# Patient Record
Sex: Female | Born: 1990 | Race: White | Hispanic: No | Marital: Single | Smoking: Former smoker
Health system: Southern US, Community
[De-identification: ages and names within clinical notes are randomized; demographics above are authoritative.]

## PROBLEM LIST (undated history)

## (undated) DIAGNOSIS — F329 Major depressive disorder, single episode, unspecified: Secondary | ICD-10-CM

## (undated) DIAGNOSIS — E0590 Thyrotoxicosis, unspecified without thyrotoxic crisis or storm: Secondary | ICD-10-CM

## (undated) DIAGNOSIS — E282 Polycystic ovarian syndrome: Secondary | ICD-10-CM

## (undated) DIAGNOSIS — F419 Anxiety disorder, unspecified: Secondary | ICD-10-CM

## (undated) DIAGNOSIS — F32A Depression, unspecified: Secondary | ICD-10-CM

---
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
## 2010-03-12 ENCOUNTER — Emergency Department: Payer: Self-pay | Admitting: Emergency Medicine

## 2011-07-30 ENCOUNTER — Ambulatory Visit: Payer: Self-pay | Admitting: Family Medicine

## 2011-11-16 ENCOUNTER — Ambulatory Visit: Payer: Self-pay | Admitting: Family Medicine

## 2013-01-08 IMAGING — US US EXTREM LOW VENOUS*L*
1 series · 14 of 24 positions shown · non-contrast
Comparison: none

REASON FOR EXAM: left leg pain and swelling eval for DVT
COMMENTS:

[Series 1: us extrem low venous*left* · 0.09mm/px · 14 of 27 slices shown]
[im 1/27]
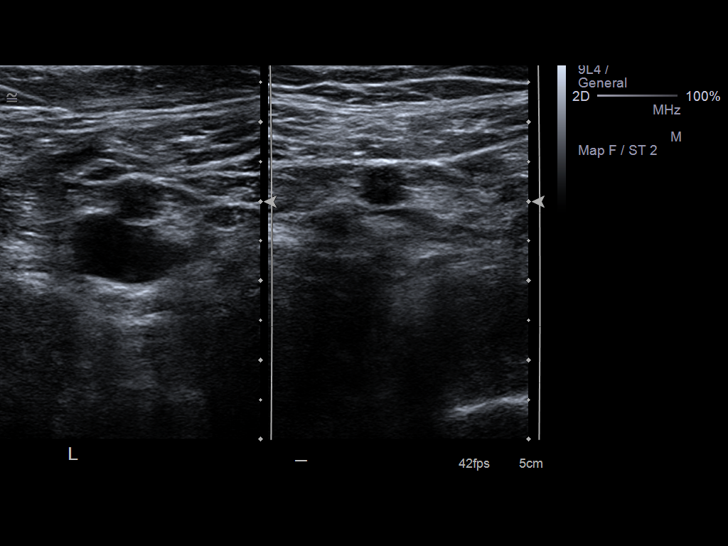
[im 3/27]
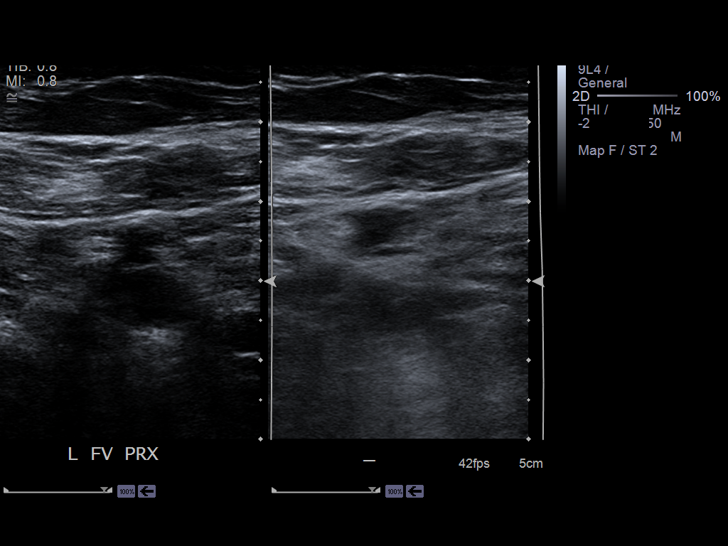
[im 5/27]
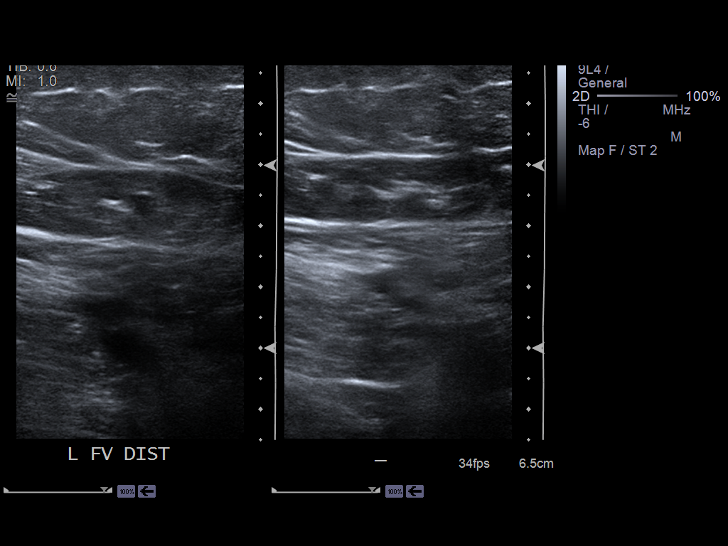
[im 7/27]
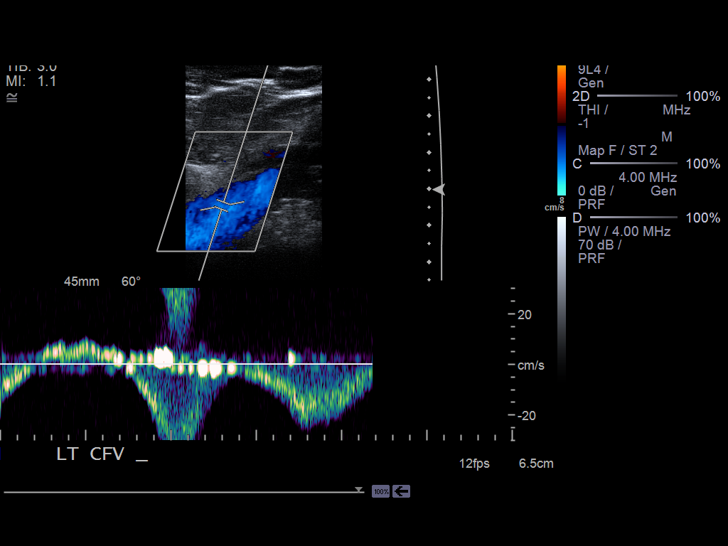
[im 8/27]
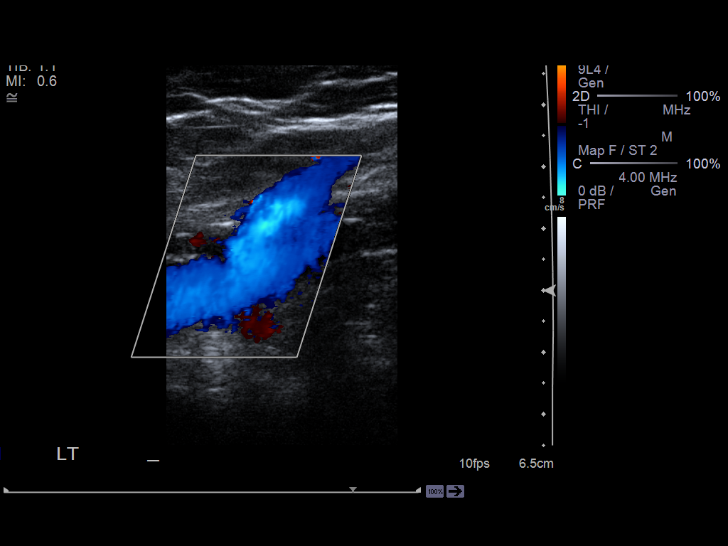
[im 11/27]
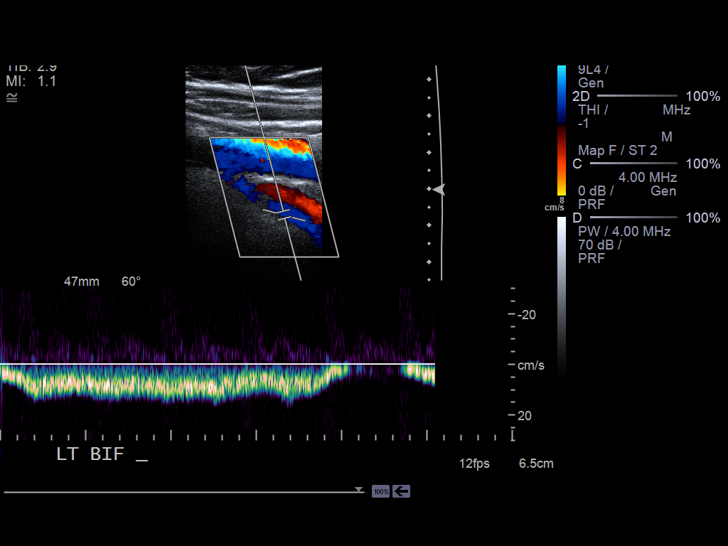
[im 13/27]
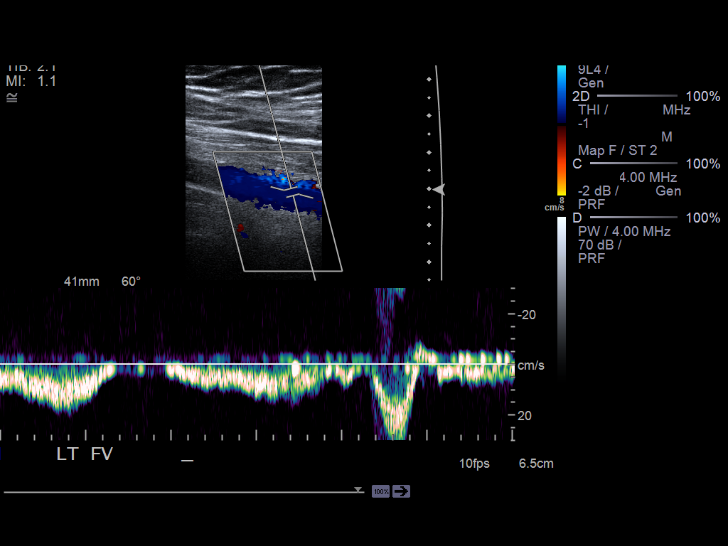
[im 14/27]
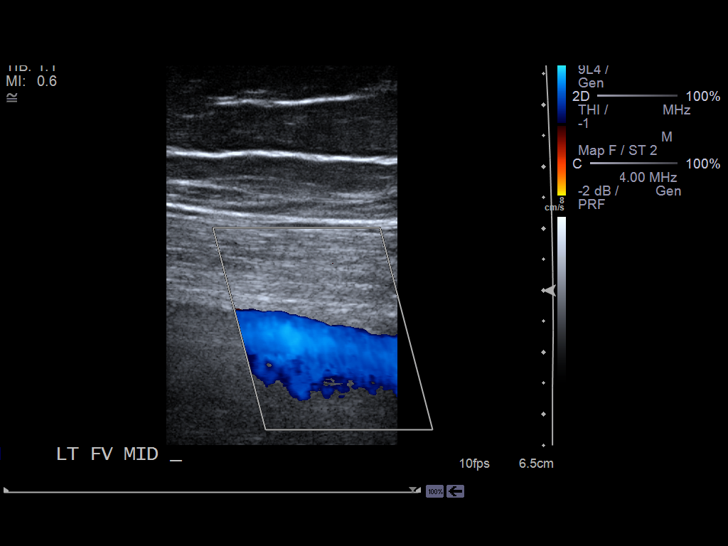
[im 16/27]
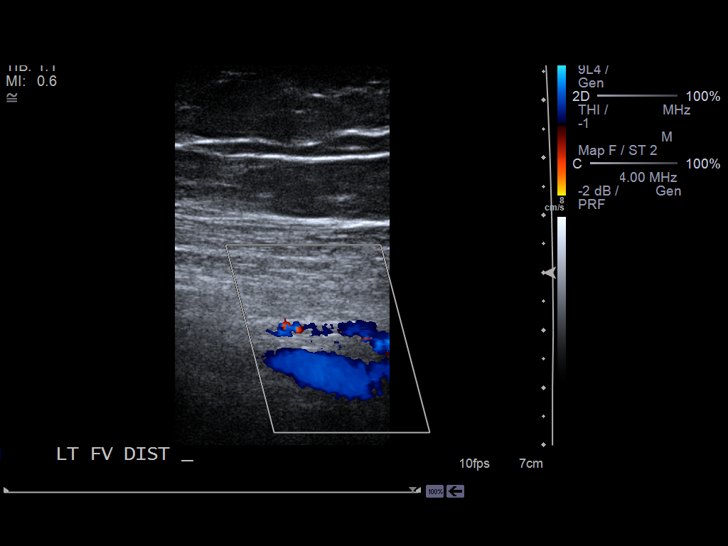
[im 19/27]
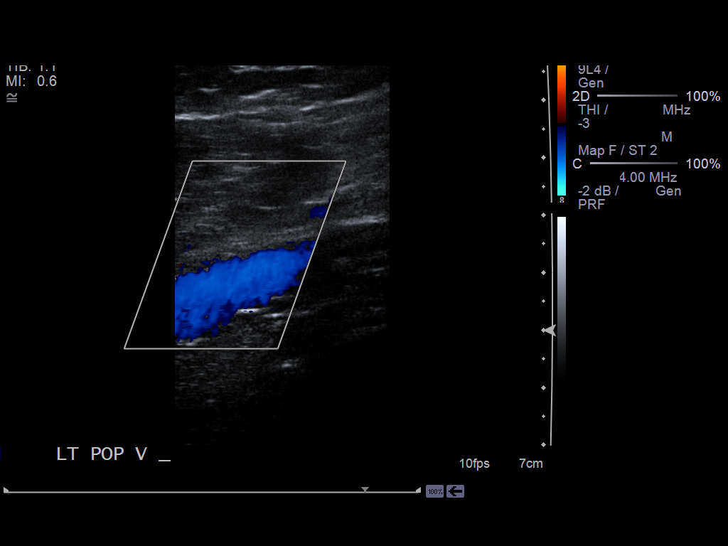
[im 21/27]
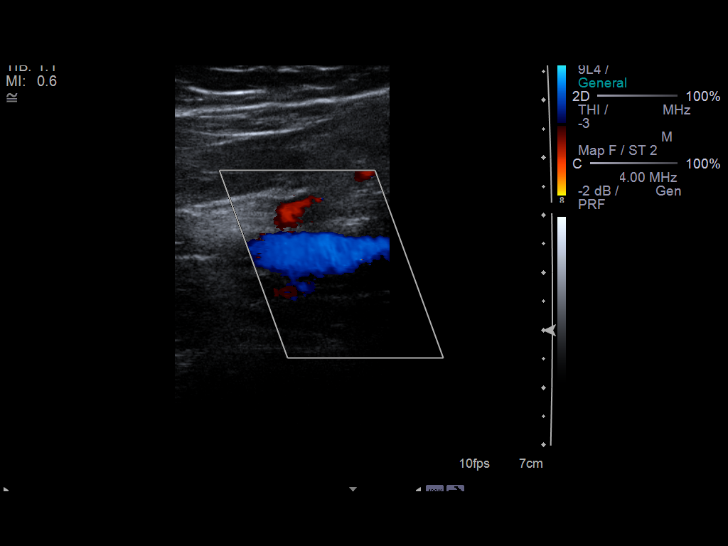
[im 22/27]
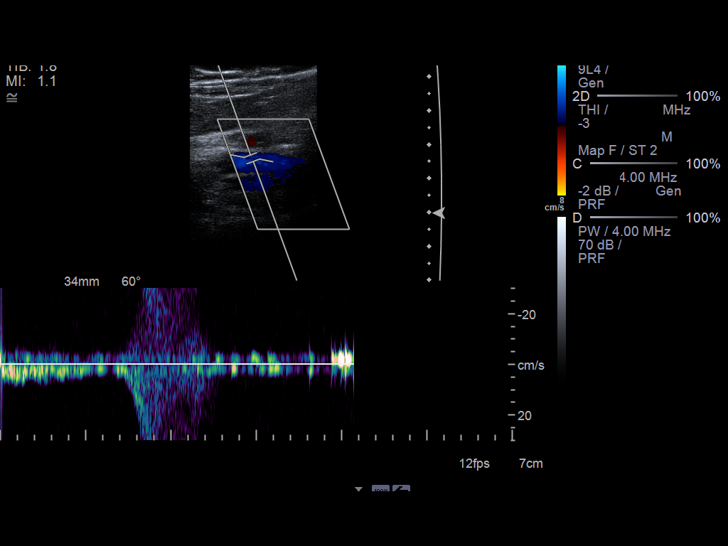
[im 24/27]
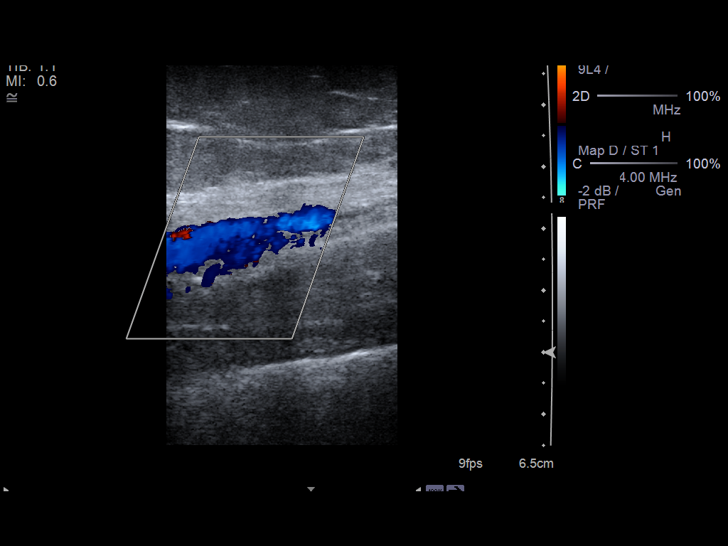
[im 27/27]
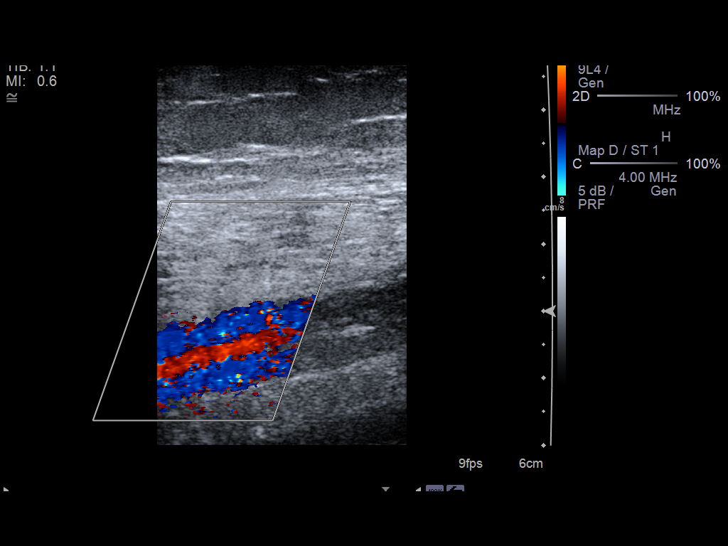

[14 of 24 positions shown; findings below may reference images not displayed]

PROCEDURE:     US  - US DOPPLER LOW EXTR LEFT  - July 30, 2011  [DATE]

RESULT:     The phasic, augmentation and Valsalva flow waveforms are normal
in appearance. The left femoral and popliteal vein shows complete
compressibility throughout its course. Doppler examination shows no
occlusion or evidence for deep vein thrombosis.
IMPRESSION: No deep vein thrombosis is identified in the left leg.

[REDACTED]

## 2013-04-27 IMAGING — CT CT ABD-PELV W/ CM
1 of 2 series · 15 of 32 positions shown, 19 images · non-contrast
Comparison: none

REASON FOR EXAM: pelvic pain  abd pain
COMMENTS:

PROCEDURE:     CT  - CT ABDOMEN / PELVIS  W  - November 16, 2011  [DATE]
RESULT:     CT abdomen and pelvis dated 11/16/2011.
TECHNIQUE: Helical 3 mm sections were obtained from the lung bases through
the pubic symphysis status post intravenous administration of 100 mL of
[id number] and oral contrast.

[Series 2: soft tissue · axial · 0.81mm/px · z∈[-528,-66]mm · 15 of 170 slices shown, 19 images]
[im 8/170  soft-tissue]
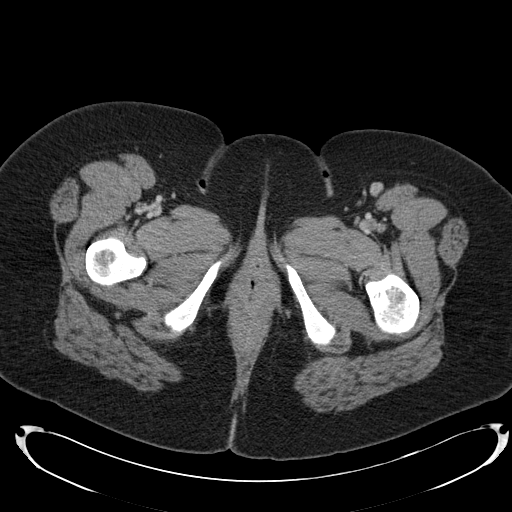
[im 8/170  bone]
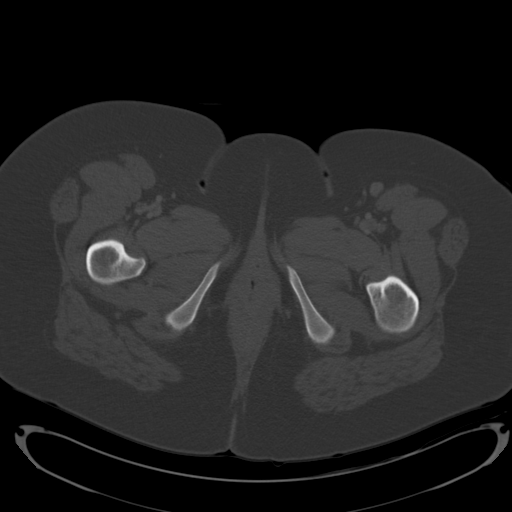
[im 23/170  soft-tissue]
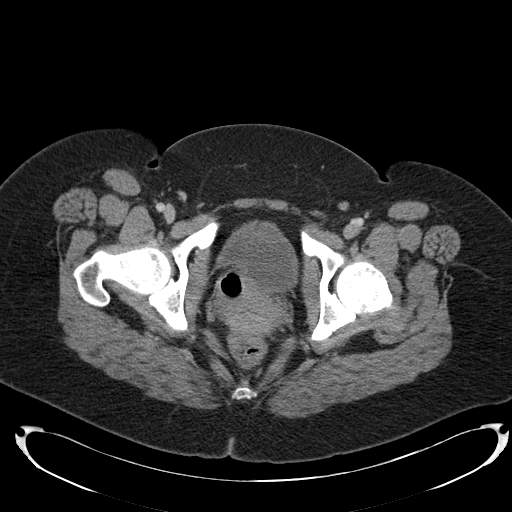
[im 37/170  soft-tissue]
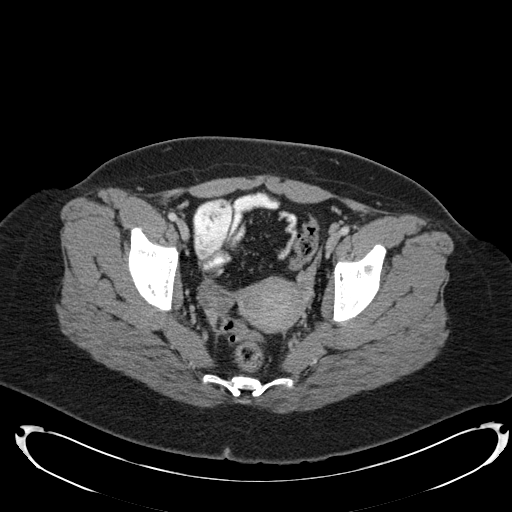
[im 45/170  soft-tissue]
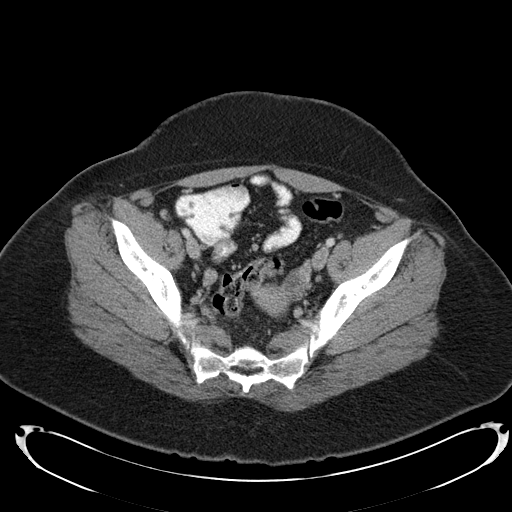
[im 59/170  soft-tissue]
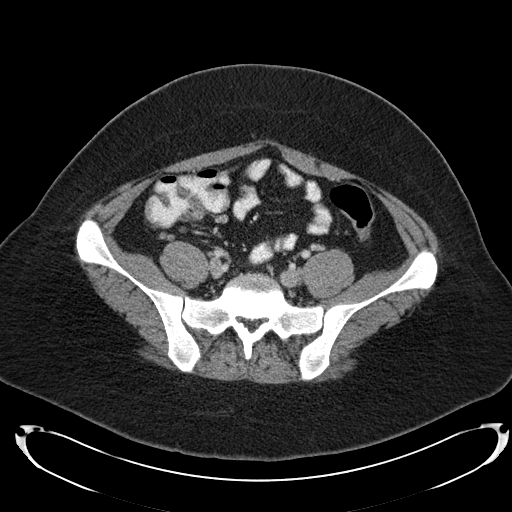
[im 74/170  soft-tissue]
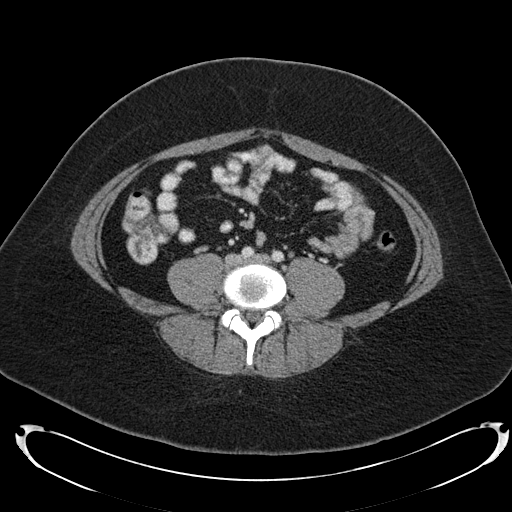
[im 89/170  soft-tissue]
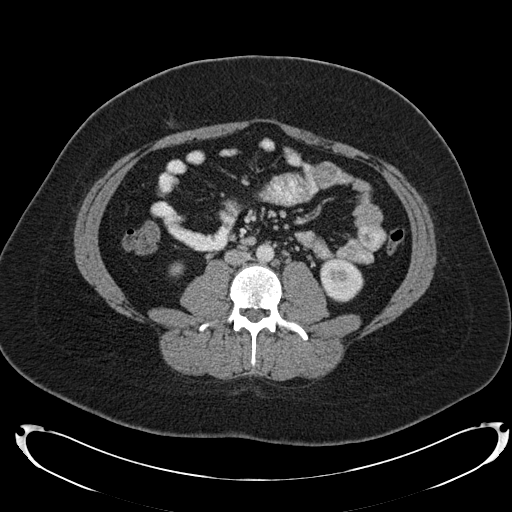
[im 96/170  soft-tissue]
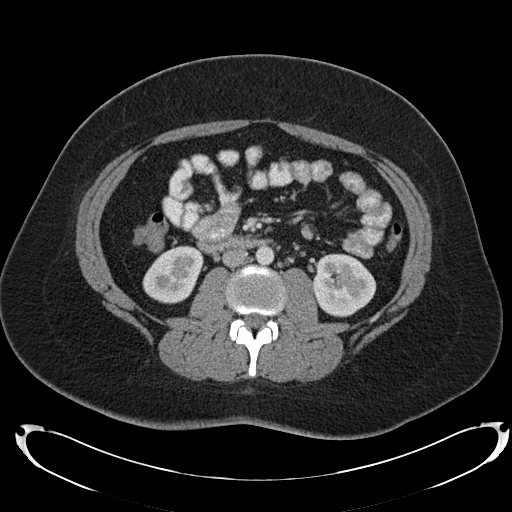
[im 111/170  soft-tissue]
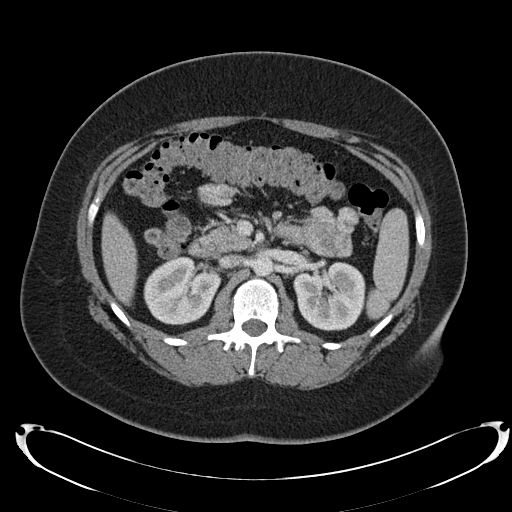
[im 111/170  bone]
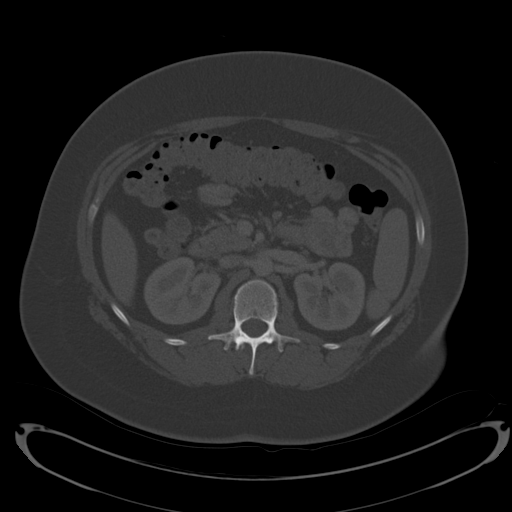
[im 125/170  soft-tissue]
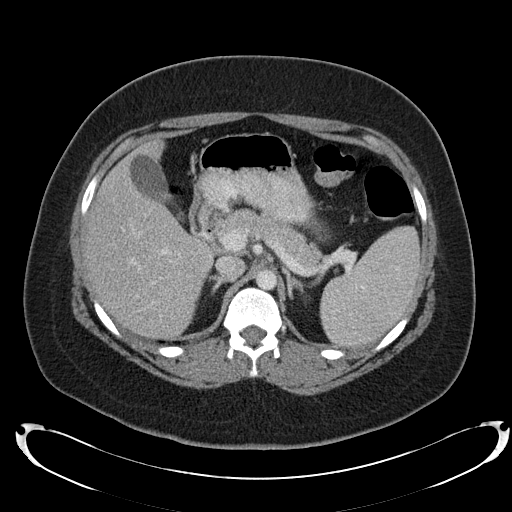
[im 133/170  soft-tissue]
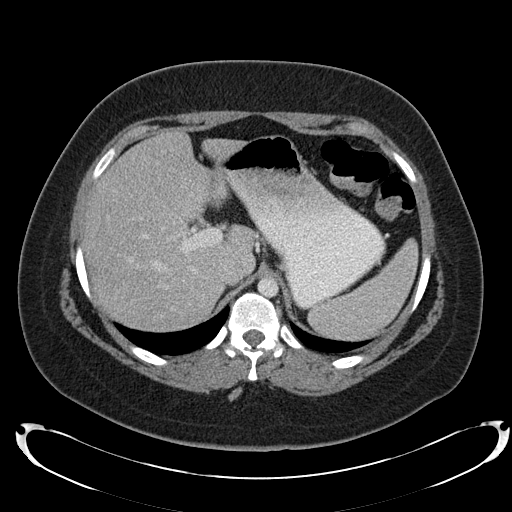
[im 140/170  lung]
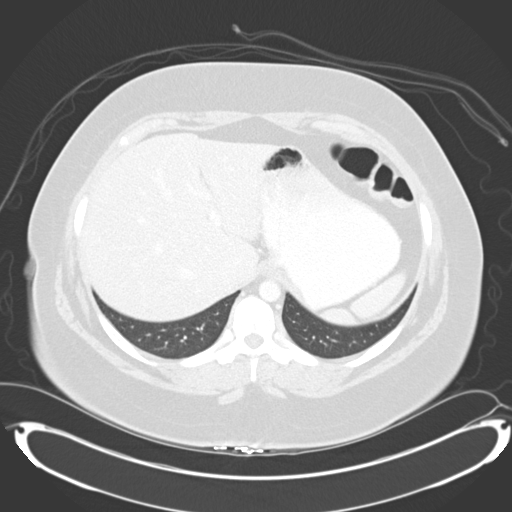
[im 147/170  soft-tissue]
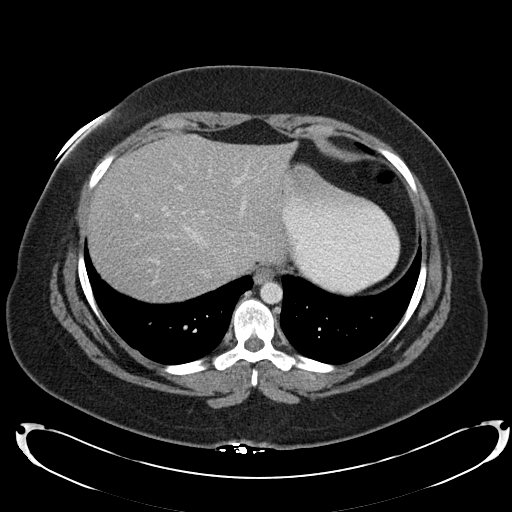
[im 147/170  lung]
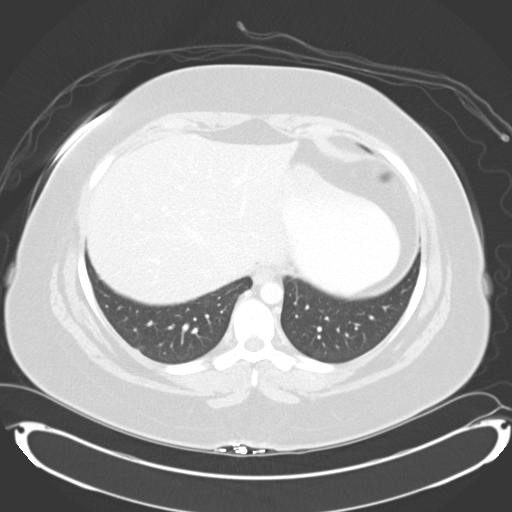
[im 155/170  lung]
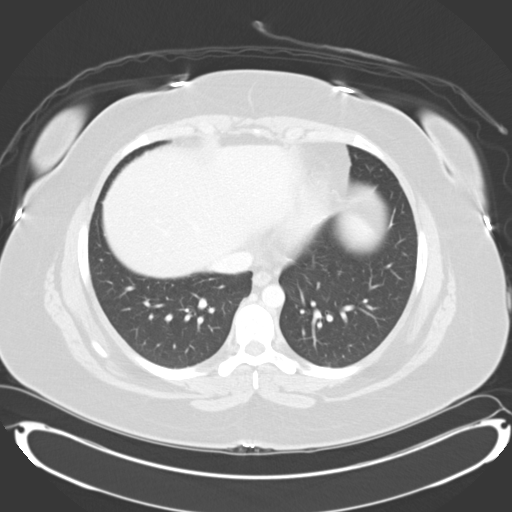
[im 162/170  soft-tissue]
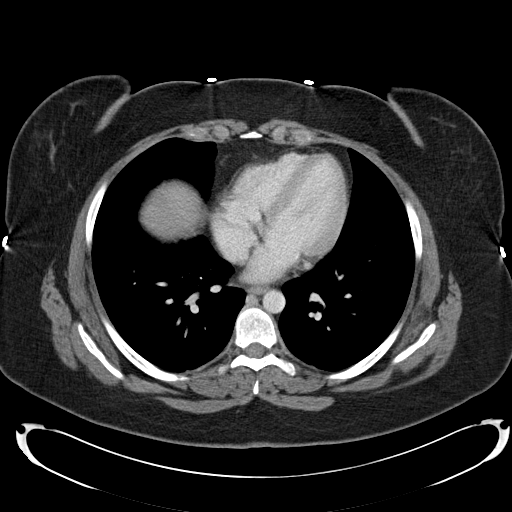
[im 162/170  lung]
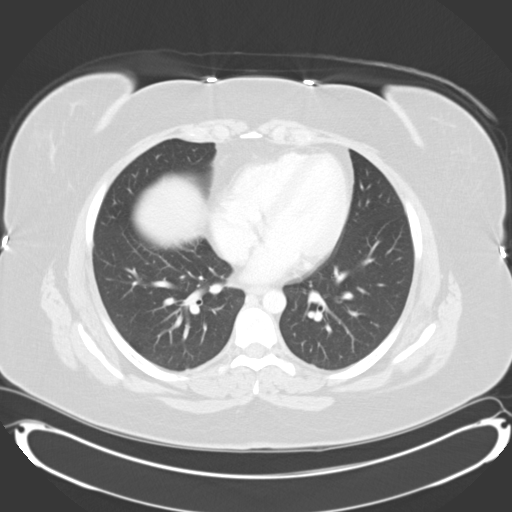

[15 of 32 positions shown; findings below may reference images not displayed]

FINDINGS: The lung bases are unremarkable.

The liver demonstrates a diffusely mild low attenuating architecture and is
otherwise unremarkable. The spleen, adrenals, pancreas, kidneys are
unremarkable. There is no evidence of an abdominal aortic aneurysm. The
celiac, SMA, IMA, portal vein, SMV are opacified.

There is no evidence of bowel obstruction, enteritis, diverticulitis common
nor appendicitis. The appendix is identified and is unremarkable.
Subcentimeter lymph nodes identified within the paracolic gutter region,
pericaval, periaortic and mesenteric regions.

There is no evidence of abdominal pelvic masses, free fluid or loculated
fluid collections.
IMPRESSION: Nonspecific subcentimeter lymph nodes within the abdomen
and retroperitoneal regions described above. These may represent reactive
lymph nodes no etiologies such as mesenteric adenitis of diagnostic
consideration.
2. Otherwise no further evidence of obstructive or inflammatory
abnormalities.

## 2017-08-09 ENCOUNTER — Ambulatory Visit
Admission: EM | Admit: 2017-08-09 | Discharge: 2017-08-09 | Disposition: A | Discharge status: Home / Self Care | Payer: Medicaid Other | Attending: Family Medicine | Admitting: Family Medicine

## 2017-08-09 ENCOUNTER — Other Ambulatory Visit: Payer: Self-pay

## 2017-08-09 DIAGNOSIS — M545 Low back pain: Secondary | ICD-10-CM | POA: Diagnosis not present

## 2017-08-09 HISTORY — DX: Depression, unspecified: F32.A

## 2017-08-09 HISTORY — DX: Polycystic ovarian syndrome: E28.2

## 2017-08-09 HISTORY — DX: Anxiety disorder, unspecified: F41.9

## 2017-08-09 HISTORY — DX: Major depressive disorder, single episode, unspecified: F32.9

## 2017-08-09 MED ORDER — CYCLOBENZAPRINE HCL 10 MG PO TABS: 10.0000 mg | ORAL_TABLET | Freq: Two times a day (BID) | ORAL | 0 refills | Status: AC | PRN

## 2017-08-09 MED ORDER — MELOXICAM 15 MG PO TABS: 15.0000 mg | ORAL_TABLET | Freq: Every day | ORAL | 0 refills | Status: DC | PRN

## 2017-08-09 MED ORDER — KETOROLAC TROMETHAMINE 60 MG/2ML IM SOLN
60.0000 mg | Freq: Once | INTRAMUSCULAR | Status: AC
  Administered 2017-08-09: 60 mg via INTRAMUSCULAR

## 2017-08-09 NOTE — ED Triage Notes (Signed)
Pt with low back pain after bending over while putting together a crib last p.m. Felt a "pop" in her low back and then had some pain running down her right leg. Pain 8/10

## 2017-08-09 NOTE — ED Provider Notes (Signed)
[facility] ____________________________________________  Time seen: Approximately 12:10 PM  I have reviewed the triage vital signs and the nursing notes.   HISTORY  Chief Complaint Back Pain  HPI Maria Melton is a 27 y.o. female presenting for evaluation of low back pain present since last night.  Patient states that she was helping put the fitted sheet on her niece's crib last night, and doing states she was bending over the crib for a few minutes.  States when she stood back up she felt a pop to her low back with associated pain.  Denies any fall to the ground, direct trauma or direct injury.  Reports pain has been in the low back since, but states initially she had some right sided leg pain radiation with tingling sensation lasting for a few hours and has since resolved.  Denies any urinary or bowel retention or incontinence. Denies pain radiation, abdominal pain, dysuria, fevers.  States that she does have a history of some intermittent low back pain and sciatica.  States she has had sciatic issues in the past, with last few years ago.  States that she did take over-the-counter ibuprofen last night without much change.  States the pain is better with sitting completely still and upright, but states as soon as she moves she feels like catching pain. Denies chest pain, shortness of breath, abdominal pain, or rash. Denies recent sickness. Denies recent antibiotic use.   Etheleen Nicks, NP: PCP Patient's last menstrual period was 07/22/2017.Denies pregnancy.   Past Medical History:  Diagnosis Date  . Anxiety   . Depression   . PCOS (polycystic ovarian syndrome)     There are no active problems to display for this patient.   History reviewed. No pertinent surgical history.   No current facility-administered medications for this encounter.   Current Outpatient Medications:  .  buPROPion (WELLBUTRIN XL) 150 MG 24 hr tablet, Take by mouth., Disp: , Rfl:  .   metFORMIN (GLUCOPHAGE) 500 MG tablet, Take by mouth., Disp: , Rfl:  .  spironolactone (ALDACTONE) 50 MG tablet, Take by mouth., Disp: , Rfl:  .  cyclobenzaprine (FLEXERIL) 10 MG tablet, Take 1 tablet (10 mg total) by mouth 2 (two) times daily as needed for muscle spasms. Do not drive while taking as can cause drowsiness, Disp: 15 tablet, Rfl: 0 .  levonorgestrel (MIRENA, 52 MG,) 20 MCG/24HR IUD, by Intrauterine route., Disp: , Rfl:  .  meloxicam (MOBIC) 15 MG tablet, Take 1 tablet (15 mg total) by mouth daily as needed., Disp: 10 tablet, Rfl: 0  Allergies Patient has no known allergies.  History reviewed. No pertinent family history.  Social History Social History   Tobacco Use  . Smoking status: Current Every Day Smoker    Packs/day: 0.25    Types: Cigarettes  . Smokeless tobacco: Never Used  Substance Use Topics  . Alcohol use: Never    Frequency: Never  . Drug use: Never    Review of Systems Constitutional: No fever/chills Cardiovascular: Denies chest pain. Respiratory: Denies shortness of breath. Gastrointestinal: No abdominal pain.  No nausea, no vomiting.  No diarrhea.  Genitourinary: Negative for dysuria. Musculoskeletal: positive for back pain. Skin: Negative for rash.  ____________________________________________   PHYSICAL EXAM:  VITAL SIGNS: ED Triage Vitals  Enc Vitals Group     BP 08/09/17 1120 122/82     Pulse Rate 08/09/17 1120 76     Resp 08/09/17 1120 16     Temp 08/09/17 1120 98.9 F (  37.2 C)     Temp Source 08/09/17 1120 Oral     SpO2 08/09/17 1120 99 %     Weight 08/09/17 1121 225 lb (102.1 kg)     Height 08/09/17 1121 5\' 6"  (1.676 m)     Head Circumference --      Peak Flow --      Pain Score 08/09/17 1121 8     Pain Loc --      Pain Edu? --      Excl. in GC? --     Constitutional: Alert and oriented. Well appearing and in no acute distress ENT      Head: Normocephalic and atraumatic. Cardiovascular: Normal rate, regular rhythm.  Grossly normal heart sounds.  Good peripheral circulation. Respiratory: Normal respiratory effort without tachypnea nor retractions. Breath sounds are clear and equal bilaterally. No wheezes, rales, rhonchi. Gastrointestinal: Soft and nontender.  Musculoskeletal:Bilateral pedal pulses equal and easily palpated.  Ambulatory with steady gait.  Changes positions quickly in room without distress.      Right lower leg:  No tenderness or edema.      Left lower leg:  No tenderness or edema.  Except: Diffuse midline lumbar mild to moderate tenderness to palpation and mild paralumbar tenderness to palpation.  Pain increases with lumbar flexion, extension as well as rotation but full range of motion present.  No saddle anesthesia.  No pain with standing bilateral knee lifts.  2+ bilateral patellar and Achilles reflexes present bilaterally.   Neurologic:  Normal speech and language. No gross focal neurologic deficits are appreciated. Speech is normal. No gait instability.  Skin:  Skin is warm, dry and intact. No rash noted. Psychiatric: Mood and affect are normal. Speech and behavior are normal. Patient exhibits appropriate insight and judgment   ___________________________________________   LABS (all labs ordered are listed, but only abnormal results are displayed)  Labs Reviewed - No data to display   PROCEDURES Procedures    INITIAL IMPRESSION / ASSESSMENT AND PLAN / ED COURSE  Pertinent labs & imaging results that were available during my care of the patient were reviewed by me and considered in my medical decision making (see chart for details).  Well-appearing patient.  No acute distress.  Presenting for evaluation of back pain.  No direct trauma.  Pain after standing up from bending position.  Suspect strain injury, also discussed disc issues.  Will defer x-ray at this time, patient agrees.  Will treat patient with daily Mobic and PRN muscle relaxant.  60 mg IM Toradol given once in urgent  care.  Encouraged rest, stretching, supportive care.  Follow-up with PCP or orthopedic as needed for continued pain.Discussed indication, risks and benefits of medications with patient.  Discussed follow up and return parameters including no resolution or any worsening concerns. Patient verbalized understanding and agreed to plan.   ____________________________________________   FINAL CLINICAL IMPRESSION(S) / ED DIAGNOSES  Final diagnoses:  Acute bilateral low back pain, with sciatica presence unspecified     ED Discharge Orders        Ordered    cyclobenzaprine (FLEXERIL) 10 MG tablet  2 times daily PRN     08/09/17 1211    meloxicam (MOBIC) 15 MG tablet  Daily PRN     08/09/17 1211       Note: This dictation was prepared with Dragon dictation along with smaller phrase technology. Any transcriptional errors that result from this process are unintentional.         Hyacinth Meeker,  Mardella LaymanLindsey, NP 08/09/17 1244

## 2017-08-09 NOTE — Discharge Instructions (Addendum)
Take medication as prescribed. Rest. Drink plenty of fluids. Stretch as discussed.   Follow up with your primary care physician or the above as needed for continued pain.  Return to Urgent care or Emergency room for new or worsening concerns.

## 2017-11-06 ENCOUNTER — Encounter: Payer: Self-pay | Admitting: *Deleted

## 2017-11-06 ENCOUNTER — Other Ambulatory Visit: Payer: Self-pay

## 2017-11-06 ENCOUNTER — Emergency Department: Payer: Medicaid Other

## 2017-11-06 ENCOUNTER — Emergency Department
Admission: EM | Admit: 2017-11-06 | Discharge: 2017-11-06 | Disposition: A | Discharge status: Home / Self Care | Payer: Medicaid Other | Attending: Emergency Medicine | Admitting: Emergency Medicine

## 2017-11-06 DIAGNOSIS — F17210 Nicotine dependence, cigarettes, uncomplicated: Secondary | ICD-10-CM | POA: Diagnosis not present

## 2017-11-06 DIAGNOSIS — R079 Chest pain, unspecified: Secondary | ICD-10-CM | POA: Diagnosis present

## 2017-11-06 DIAGNOSIS — Z3202 Encounter for pregnancy test, result negative: Secondary | ICD-10-CM | POA: Insufficient documentation

## 2017-11-06 DIAGNOSIS — R0602 Shortness of breath: Secondary | ICD-10-CM | POA: Insufficient documentation

## 2017-11-06 DIAGNOSIS — R05 Cough: Secondary | ICD-10-CM | POA: Diagnosis not present

## 2017-11-06 DIAGNOSIS — R0789 Other chest pain: Secondary | ICD-10-CM | POA: Diagnosis not present

## 2017-11-06 HISTORY — DX: Thyrotoxicosis, unspecified without thyrotoxic crisis or storm: E05.90

## 2017-11-06 LAB — BASIC METABOLIC PANEL
Anion gap: 9 (ref 5–15)
BUN: 9 mg/dL (ref 6–20)
CALCIUM: 9.3 mg/dL (ref 8.9–10.3)
CO2: 26 mmol/L (ref 22–32)
CREATININE: 0.74 mg/dL (ref 0.44–1.00)
Chloride: 103 mmol/L (ref 98–111)
GLUCOSE: 94 mg/dL (ref 70–99)
Potassium: 4 mmol/L (ref 3.5–5.1)
SODIUM: 138 mmol/L (ref 135–145)

## 2017-11-06 LAB — CBC
HCT: 46.5 % (ref 35.0–47.0)
Hemoglobin: 16.4 g/dL — ABNORMAL HIGH (ref 12.0–16.0)
MCH: 31.3 pg (ref 26.0–34.0)
MCHC: 35.2 g/dL (ref 32.0–36.0)
MCV: 88.8 fL (ref 80.0–100.0)
PLATELETS: 400 10*3/uL (ref 150–440)
RBC: 5.23 MIL/uL — ABNORMAL HIGH (ref 3.80–5.20)
RDW: 13 % (ref 11.5–14.5)
WBC: 15.3 10*3/uL — ABNORMAL HIGH (ref 3.6–11.0)

## 2017-11-06 LAB — FIBRIN DERIVATIVES D-DIMER (ARMC ONLY): Fibrin derivatives D-dimer (ARMC): 194.48 ng/mL (FEU) (ref 0.00–499.00)

## 2017-11-06 LAB — POCT PREGNANCY, URINE: PREG TEST UR: NEGATIVE

## 2017-11-06 LAB — TROPONIN I

## 2017-11-06 MED ORDER — MELOXICAM 15 MG PO TABS: 15.0000 mg | ORAL_TABLET | Freq: Every day | ORAL | 2 refills | Status: AC

## 2017-11-06 MED ORDER — IBUPROFEN 800 MG PO TABS: 800.0000 mg | ORAL_TABLET | Freq: Three times a day (TID) | ORAL | 0 refills | Status: DC | PRN

## 2017-11-06 MED ORDER — DIAZEPAM 5 MG PO TABS: 5.0000 mg | ORAL_TABLET | Freq: Three times a day (TID) | ORAL | 0 refills | Status: AC | PRN

## 2017-11-06 MED ORDER — KETOROLAC TROMETHAMINE 30 MG/ML IJ SOLN
30.0000 mg | Freq: Once | INTRAMUSCULAR | Status: AC
  Administered 2017-11-06: 30 mg via INTRAVENOUS
  Filled 2017-11-06: qty 1

## 2017-11-06 NOTE — ED Provider Notes (Signed)
[facility] Emergency Department Provider Note       Time seen: ----------------------------------------- 9:02 PM on 11/06/2017 -----------------------------------------   I have reviewed the triage vital signs and the nursing notes.  HISTORY   Chief Complaint Chest Pain and Shortness of Breath    HPI Maria Melton is a 27 y.o. female with a history of anxiety, depression, hyperthyroidism and polycystic ovaries who presents to the ED for congestion and cough last week that turned into chest pain shortness of breath in the past 3 days.  Patient reports pain is worse when swallowing or moving.  She does not have any cardiac or pulmonary history.  She reports she started vaping recently and is concerned that this may be related.  Past Medical History:  Diagnosis Date  . Anxiety   . Depression   . Hyperthyroidism   . PCOS (polycystic ovarian syndrome)     There are no active problems to display for this patient.   History reviewed. No pertinent surgical history.  Allergies Patient has no known allergies.  Social History Social History   Tobacco Use  . Smoking status: Current Every Day Smoker    Packs/day: 0.25    Types: Cigarettes  . Smokeless tobacco: Never Used  Substance Use Topics  . Alcohol use: Never    Frequency: Never  . Drug use: Never   Review of Systems Constitutional: Negative for fever. Cardiovascular: Positive for chest pain Respiratory: Positive for difficulty breathing Gastrointestinal: Negative for abdominal pain, vomiting and diarrhea. Musculoskeletal: Negative for back pain. Skin: Negative for rash. Neurological: Negative for headaches, focal weakness or numbness.  All systems negative/normal/unremarkable except as stated in the HPI  ____________________________________________   PHYSICAL EXAM:  VITAL SIGNS: ED Triage Vitals  Enc Vitals Group     BP 11/06/17 1721 123/73     Pulse Rate 11/06/17 1721 (!)  102     Resp 11/06/17 2042 16     Temp 11/06/17 1721 99.8 F (37.7 C)     Temp Source 11/06/17 1721 Oral     SpO2 11/06/17 1721 96 %     Weight 11/06/17 1721 220 lb (99.8 kg)     Height 11/06/17 1721 5\' 7"  (1.702 m)     Head Circumference --      Peak Flow --      Pain Score 11/06/17 1730 8     Pain Loc --      Pain Edu? --      Excl. in GC? --    Constitutional: Alert and oriented. Well appearing and in no distress. Eyes: Conjunctivae are normal. Normal extraocular movements. ENT   Head: Normocephalic and atraumatic.   Nose: No congestion/rhinnorhea.   Mouth/Throat: Mucous membranes are moist.   Neck: No stridor. Cardiovascular: Normal rate, regular rhythm. No murmurs, rubs, or gallops. Respiratory: Normal respiratory effort without tachypnea nor retractions. Breath sounds are clear and equal bilaterally. No wheezes/rales/rhonchi. Gastrointestinal: Soft and nontender. Normal bowel sounds Musculoskeletal: Nontender with normal range of motion in extremities. No lower extremity tenderness nor edema. Neurologic:  Normal speech and language. No gross focal neurologic deficits are appreciated.  Skin:  Skin is warm, dry and intact. No rash noted. Psychiatric: Mood and affect are normal. Speech and behavior are normal.  ____________________________________________  EKG: Interpreted by me.  Sinus rhythm rate 97 bpm, normal PR interval, normal QRS, normal QT  ____________________________________________  ED COURSE:  As part of my medical decision making, I reviewed the following data within the  electronic MEDICAL RECORD NUMBER History obtained from family if available, nursing notes, old chart and ekg, as well as notes from prior ED visits. Patient presented for chest pain, we will assess with labs and imaging as indicated at this time.   Procedures ____________________________________________   LABS (pertinent positives/negatives)  Labs Reviewed  CBC - Abnormal; Notable  for the following components:      Result Value   WBC 15.3 (*)    RBC 5.23 (*)    Hemoglobin 16.4 (*)    All other components within normal limits  BASIC METABOLIC PANEL  TROPONIN I  FIBRIN DERIVATIVES D-DIMER ([facility] ONLY)  POC URINE PREG, ED  POCT PREGNANCY, URINE    RADIOLOGY Images were viewed by me  Chest x-ray IMPRESSION: No active cardiopulmonary disease. ____________________________________________  DIFFERENTIAL DIAGNOSIS   Costochondritis, chest wall pain, anxiety, GERD, unstable angina or PE unlikely  FINAL ASSESSMENT AND PLAN  Chest pain   Plan: The patient had presented for atypical chest pain. Patient's labs did not reveal any acute process, white blood cell count is slightly elevated for him no apparent reason. Patient's imaging did not reveal any acute process either.  She is cleared for outpatient follow-up.   Ulice Dash, MD   Note: This note was generated in part or whole with voice recognition software. Voice recognition is usually quite accurate but there are transcription errors that can and very often do occur. I apologize for any typographical errors that were not detected and corrected.     Emily Filbert, MD 11/06/17 2205

## 2017-11-06 NOTE — ED Notes (Signed)
FIRST NURSE NOTE-pulled for EKG, c/o chest pain. Ambulatory, NAD

## 2017-11-06 NOTE — ED Notes (Signed)
Dr Williams at bedside 

## 2017-11-06 NOTE — ED Triage Notes (Signed)
PT to ED reporting congestion and cough last week that turned into chest pains and SOB for the past three days. PT reports pain is worse when swallowing or moving. No cardiac hx and no pulmonary hx other than smoking. Pt reports ahving started vaping this past week.

## 2019-04-18 IMAGING — CR DG CHEST 2V
1 series · 2 of 2 positions shown · non-contrast
Comparison: 11/30/2006

CLINICAL DATA: Cough and congestion for 10 days.

EXAM:
CHEST - 2 VIEW

[Series 1: dg chest 2 view · 0.14mm/px · 2 of 2 slices shown]
[im 1/2]
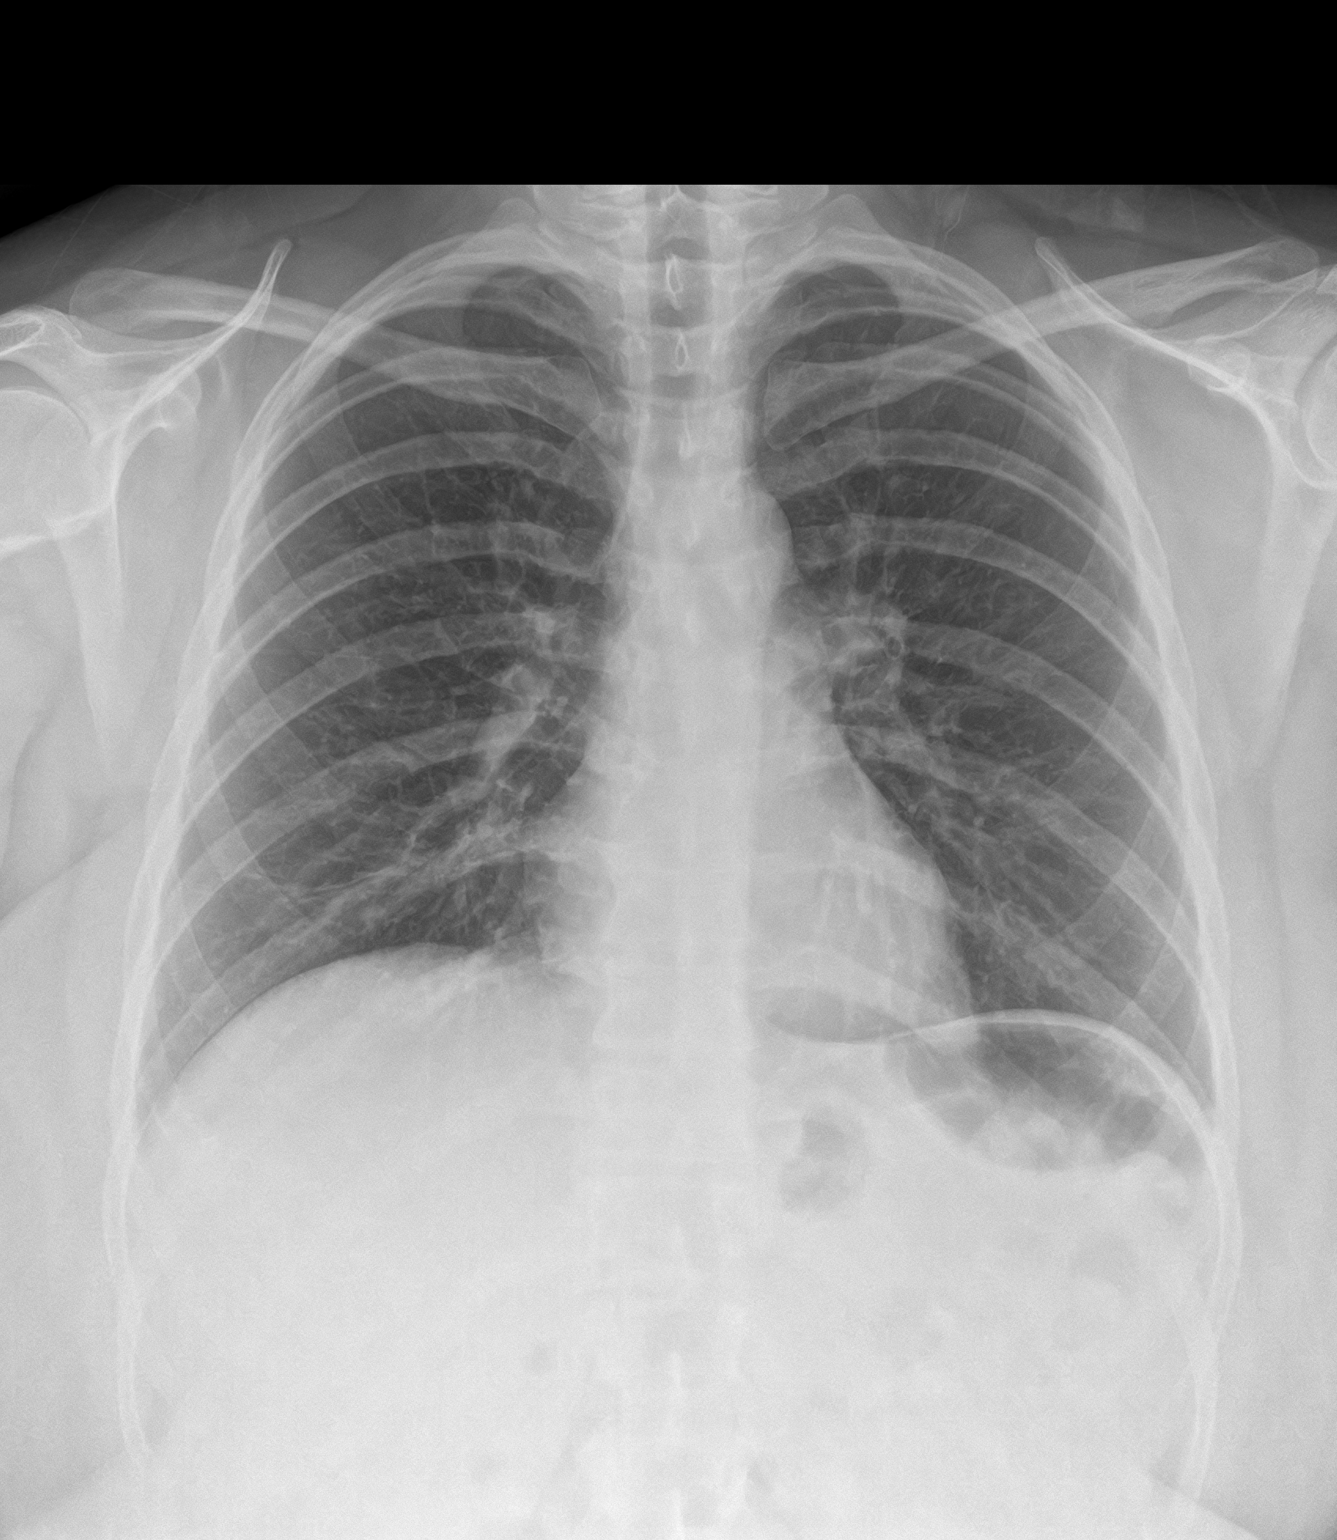
[im 2/2]
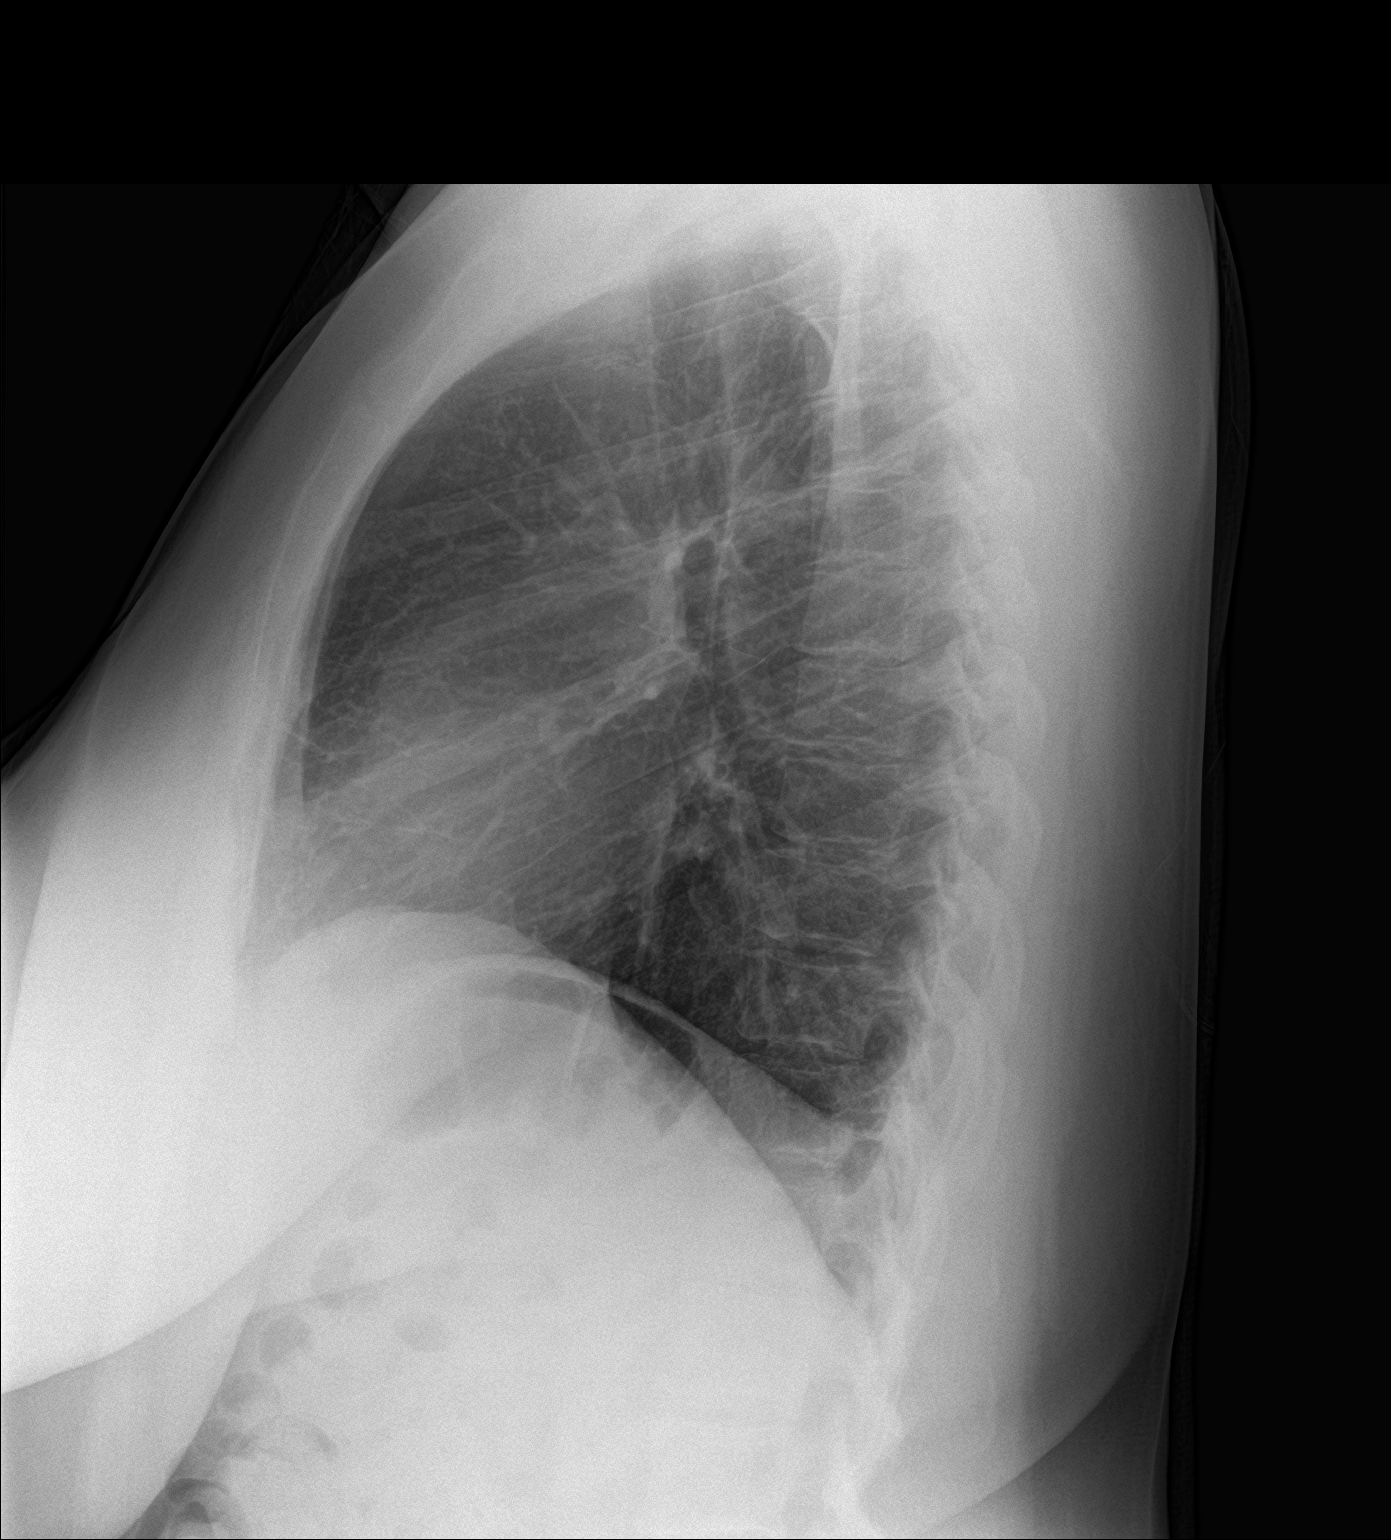

[2 of 2 positions shown; findings below may reference images not displayed]

FINDINGS: The cardiomediastinal silhouette is unremarkable.

There is no evidence of focal airspace disease, pulmonary edema,
suspicious pulmonary nodule/mass, pleural effusion, or pneumothorax.

No acute bony abnormalities are identified.
IMPRESSION: No active cardiopulmonary disease.

## 2021-05-19 ENCOUNTER — Other Ambulatory Visit: Payer: Self-pay

## 2021-05-19 ENCOUNTER — Ambulatory Visit
Admission: RE | Admit: 2021-05-19 | Discharge: 2021-05-19 | Disposition: A | Discharge status: Home / Self Care | Payer: BC Managed Care – PPO | Source: Ambulatory Visit | Attending: Emergency Medicine | Admitting: Emergency Medicine

## 2021-05-19 VITALS — BP 118/104 | HR 84 | Temp 98.3°F | Resp 18

## 2021-05-19 DIAGNOSIS — J069 Acute upper respiratory infection, unspecified: Secondary | ICD-10-CM | POA: Insufficient documentation

## 2021-05-19 DIAGNOSIS — Z20822 Contact with and (suspected) exposure to covid-19: Secondary | ICD-10-CM | POA: Insufficient documentation

## 2021-05-19 DIAGNOSIS — J0140 Acute pansinusitis, unspecified: Secondary | ICD-10-CM | POA: Insufficient documentation

## 2021-05-19 LAB — RESP PANEL BY RT-PCR (FLU A&B, COVID) ARPGX2
Influenza A by PCR: NEGATIVE
Influenza B by PCR: NEGATIVE
SARS Coronavirus 2 by RT PCR: NEGATIVE

## 2021-05-19 MED ORDER — PREDNISONE 20 MG PO TABS: 40.0000 mg | ORAL_TABLET | Freq: Every day | ORAL | 0 refills | Status: AC

## 2021-05-19 MED ORDER — IBUPROFEN 600 MG PO TABS: 600.0000 mg | ORAL_TABLET | Freq: Four times a day (QID) | ORAL | 0 refills | Status: AC | PRN

## 2021-05-19 MED ORDER — FLUTICASONE PROPIONATE 50 MCG/ACT NA SUSP: 2.0000 | Freq: Every day | NASAL | 0 refills | Status: AC

## 2021-05-19 NOTE — Discharge Instructions (Addendum)
I will contact you if and only if your COVID or flu, positive.  I will prescribe the appropriate antiviral if your testing is positive.  Otherwise, I am treating this as a viral sinusitis with Flonase, saline nasal irrigation with a Lloyd Huger Med rinse and distilled water as often as you want, stop the Claritin and start Mucinex D.  The prednisone will help with pain and swelling, take 600 mg of ibuprofen combined with 1000 mg of Tylenol 3-4 times a day for facial pain. ? ?Start Mucinex-D to keep the mucous thin and to decongest you.   You may take 600 mg of motrin with 1000 mg of tylenol up to 3-4 times a day as needed for pain. This is an effective combination for pain.  Most sinus infections are viral and do not need antibiotics unless you have a high fever, have had this for 10 days, or you get better and then get sick again. Use a NeilMed sinus rinse with distilled water as often as you want to to reduce nasal congestion. Follow the directions on the box.  ? ?Go to www.goodrx.com to look up your medications. This will give you a list of where you can find your prescriptions at the most affordable prices. Or you can ask the pharmacist what the cash price is. This is frequently cheaper than going through insurance.   ?

## 2021-05-19 NOTE — ED Triage Notes (Signed)
Patient presents to Urgent Care with complaints of nasal congestion and facial pressure since late Friday. Pt had a negative covid test yesterday. Treating symptoms with Claritin.  ?

## 2021-05-19 NOTE — ED Provider Notes (Signed)
HPI ? ?SUBJECTIVE: ? ?Maria Melton is a 31 y.o. female who presents with nasal congestion that was initially clear, has now turned into thick yellow rhinorrhea, sinus pain and pressure, postnasal drip, loss of sense of smell and taste, bilateral ear pain for the past 2 days.  No change in hearing.  She states that her ears pop when she blows her nose.  No fevers, body aches, headaches, sore throat, cough, wheeze, shortness of breath, upper dental pain, facial swelling, nausea, vomiting, diarrhea, abdominal pain.  No known COVID or flu exposure, but she is a first grade teacher.  She got both COVID and flu vaccines.  No antibiotics in the past month.  No antipyretic in the past 6 hours.  She has been suffering from allergies for the past several weeks which were controlled with Claritin up until 2 days ago.  She is taking Claritin, Tylenol Cold and flu, has tried Afrin and hot tea.  Tylenol, Afrin and hot tea help.  Symptoms worse with standing up.  She has a past medical history of COVID in February 22, influenza in December 22/January 23, allergies.  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  Denies the possibility being pregnant.  PCP: [facility] [place]. ? ? ?Past Medical History:  ?Diagnosis Date  ? Anxiety   ? Depression   ? Hyperthyroidism   ? PCOS (polycystic ovarian syndrome)   ? ? ?History reviewed. No pertinent surgical history. ? ?History reviewed. No pertinent family history. ? ?Social History  ? ?Tobacco Use  ? Smoking status: Former  ?  Packs/day: 0.25  ?  Types: Cigarettes  ?  Quit date: 2019  ?  Years since quitting: 4.2  ? Smokeless tobacco: Never  ?Vaping Use  ? Vaping Use: Never used  ?Substance Use Topics  ? Alcohol use: Never  ? Drug use: Never  ? ? ?No current facility-administered medications for this encounter. ? ?Current Outpatient Medications:  ?  fluticasone (FLONASE) 50 MCG/ACT nasal spray, Place 2 sprays into both nostrils daily., Disp: 16 g, Rfl: 0 ?  gabapentin (NEURONTIN) 100 MG capsule,  Take by mouth., Disp: , Rfl:  ?  ibuprofen (ADVIL) 600 MG tablet, Take 1 tablet (600 mg total) by mouth every 6 (six) hours as needed., Disp: 30 tablet, Rfl: 0 ?  predniSONE (DELTASONE) 20 MG tablet, Take 2 tablets (40 mg total) by mouth daily with breakfast for 5 days., Disp: 10 tablet, Rfl: 0 ?  buPROPion (WELLBUTRIN XL) 150 MG 24 hr tablet, Take by mouth., Disp: , Rfl:  ?  cyclobenzaprine (FLEXERIL) 10 MG tablet, Take 1 tablet (10 mg total) by mouth 2 (two) times daily as needed for muscle spasms. Do not drive while taking as can cause drowsiness, Disp: 15 tablet, Rfl: 0 ?  diazepam (VALIUM) 5 MG tablet, Take 1 tablet (5 mg total) by mouth every 8 (eight) hours as needed for muscle spasms., Disp: 20 tablet, Rfl: 0 ?  levonorgestrel (MIRENA, 52 MG,) 20 MCG/24HR IUD, by Intrauterine route., Disp: , Rfl:  ?  metFORMIN (GLUCOPHAGE) 500 MG tablet, Take by mouth., Disp: , Rfl:  ?  spironolactone (ALDACTONE) 50 MG tablet, Take by mouth., Disp: , Rfl:  ? ?Allergies  ?Allergen Reactions  ? Latex Hives  ? ? ? ?ROS ? ?As noted in HPI.  ? ?Physical Exam ? ?BP (!) 118/104 (BP Location: Left Arm)   Pulse 84   Temp 98.3 ?F (36.8 ?C) (Oral)   Resp 18   SpO2 98%  ? ?  Constitutional: Well developed, well nourished, no acute distress ?Eyes:  EOMI, conjunctiva normal bilaterally ?HENT: Normocephalic, atraumatic,mucus membranes moist.  TMs normal bilaterally.  Erythematous, swollen turbinates.  Clear rhinorrhea.  Positive maxillary, frontal sinus tenderness.  Unable to completely visualize posterior oropharynx. ?Respiratory: Normal inspiratory effort ?Cardiovascular: Normal rate ?GI: nondistended ?skin: No rash, skin intact ?Musculoskeletal: no deformities ?Neurologic: Alert & oriented x 3, no focal neuro deficits ?Psychiatric: Speech and behavior appropriate ? ? ?ED Course ? ? ?Medications - No data to display ? ?Orders Placed This Encounter  ?Procedures  ? Resp Panel by RT-PCR (Flu A&B, Covid) Nasopharyngeal Swab  ?  Standing  Status:   Standing  ?  Number of Occurrences:   1  ? Airborne and Contact precautions  ?  Standing Status:   Standing  ?  Number of Occurrences:   1  ? ? ?Results for orders placed or performed during the hospital encounter of 05/19/21 (from the past 24 hour(s))  ?Resp Panel by RT-PCR (Flu A&B, Covid) Nasopharyngeal Swab     Status: None  ? Collection Time: 05/19/21  1:11 PM  ? Specimen: Nasopharyngeal Swab; Nasopharyngeal(NP) swabs in vial transport medium  ?Result Value Ref Range  ? SARS Coronavirus 2 by RT PCR NEGATIVE NEGATIVE  ? Influenza A by PCR NEGATIVE NEGATIVE  ? Influenza B by PCR NEGATIVE NEGATIVE  ? ?No results found. ? ?ED Clinical Impression ? ?1. Upper respiratory tract infection, unspecified type   ?2. Acute non-recurrent pansinusitis   ?3. Encounter for laboratory testing for COVID-19 virus   ?  ? ?ED Assessment/Plan ? ?[phone].  will contact patient if COVID or flu are positive and will prescribe the appropriate antiviral.  ? ?COVID, influenza negative.  Supportive treatment.  Patient with a viral sinusitis.  Discontinue Claritin, start Mucinex D, Flonase, 40 mg of prednisone for 5 days, saline nasal irrigation, Tylenol/ibuprofen.  Discussed indications for antibiotics.  There are no clear indications for antibiotics today. ? ?Discussed labs, MDM, treatment plan, and plan for follow-up with patient. patient agrees with plan.  ? ?Meds ordered this encounter  ?Medications  ? fluticasone (FLONASE) 50 MCG/ACT nasal spray  ?  Sig: Place 2 sprays into both nostrils daily.  ?  Dispense:  16 g  ?  Refill:  0  ? predniSONE (DELTASONE) 20 MG tablet  ?  Sig: Take 2 tablets (40 mg total) by mouth daily with breakfast for 5 days.  ?  Dispense:  10 tablet  ?  Refill:  0  ? ibuprofen (ADVIL) 600 MG tablet  ?  Sig: Take 1 tablet (600 mg total) by mouth every 6 (six) hours as needed.  ?  Dispense:  30 tablet  ?  Refill:  0  ? ? ? ? ?*This clinic note was created using Scientist, clinical (histocompatibility and immunogenetics). Therefore,  there may be occasional mistakes despite careful proofreading. ? ?? ? ?  ?Domenick Gong, MD ?05/19/21 1412 ? ?
